# Patient Record
Sex: Female | Born: 1980 | Race: White | Hispanic: Yes | Marital: Single | Smoking: Never smoker
Health system: Southern US, Community
[De-identification: ages and names within clinical notes are randomized; demographics above are authoritative.]

## PROBLEM LIST (undated history)

## (undated) DIAGNOSIS — E785 Hyperlipidemia, unspecified: Secondary | ICD-10-CM

## (undated) DIAGNOSIS — K649 Unspecified hemorrhoids: Secondary | ICD-10-CM

## (undated) DIAGNOSIS — R569 Unspecified convulsions: Secondary | ICD-10-CM

## (undated) DIAGNOSIS — J45909 Unspecified asthma, uncomplicated: Secondary | ICD-10-CM

## (undated) DIAGNOSIS — F79 Unspecified intellectual disabilities: Secondary | ICD-10-CM

## (undated) HISTORY — DX: Unspecified asthma, uncomplicated: J45.909

## (undated) HISTORY — PX: NO PAST SURGERIES: SHX2092

## (undated) HISTORY — DX: Hyperlipidemia, unspecified: E78.5

---

## 2008-02-28 ENCOUNTER — Emergency Department (HOSPITAL_COMMUNITY): Admission: EM | Admit: 2008-02-28 | Discharge: 2008-02-28 | Payer: Self-pay | Admitting: Emergency Medicine

## 2011-02-19 ENCOUNTER — Encounter: Payer: Self-pay | Admitting: Emergency Medicine

## 2011-02-19 ENCOUNTER — Emergency Department (HOSPITAL_COMMUNITY)
Admission: EM | Admit: 2011-02-19 | Discharge: 2011-02-19 | Disposition: A | Discharge status: Home / Self Care | Payer: Medicaid Other | Attending: Emergency Medicine | Admitting: Emergency Medicine

## 2011-02-19 ENCOUNTER — Emergency Department (HOSPITAL_COMMUNITY): Payer: Medicaid Other

## 2011-02-19 DIAGNOSIS — R05 Cough: Secondary | ICD-10-CM | POA: Insufficient documentation

## 2011-02-19 DIAGNOSIS — R509 Fever, unspecified: Secondary | ICD-10-CM | POA: Insufficient documentation

## 2011-02-19 DIAGNOSIS — R059 Cough, unspecified: Secondary | ICD-10-CM | POA: Insufficient documentation

## 2011-02-19 DIAGNOSIS — R0602 Shortness of breath: Secondary | ICD-10-CM | POA: Insufficient documentation

## 2011-02-19 DIAGNOSIS — Z79899 Other long term (current) drug therapy: Secondary | ICD-10-CM | POA: Insufficient documentation

## 2011-02-19 DIAGNOSIS — F79 Unspecified intellectual disabilities: Secondary | ICD-10-CM | POA: Insufficient documentation

## 2011-02-19 DIAGNOSIS — J159 Unspecified bacterial pneumonia: Secondary | ICD-10-CM | POA: Insufficient documentation

## 2011-02-19 HISTORY — DX: Unspecified intellectual disabilities: F79

## 2011-02-19 MED ORDER — AZITHROMYCIN 250 MG PO TABS
500.0000 mg | ORAL_TABLET | Freq: Once | ORAL | Status: AC
  Administered 2011-02-19: 500 mg via ORAL
  Filled 2011-02-19: qty 2

## 2011-02-19 MED ORDER — AZITHROMYCIN 250 MG PO TABS: 250.0000 mg | ORAL_TABLET | Freq: Every day | ORAL | Status: AC

## 2011-02-19 NOTE — ED Notes (Signed)
Patient transported to X-ray 

## 2011-02-19 NOTE — ED Notes (Signed)
Crushed pills and administered with applesauce. well tolerated

## 2011-02-19 NOTE — ED Provider Notes (Signed)
History     CSN: [id number] Arrival date & time: 02/19/2011  3:50 AM   First MD Initiated Contact with Patient 02/19/11 [phone]      Chief Complaint  Patient presents with  . Cough    (Consider location/radiation/quality/duration/timing/severity/associated sxs/prior treatment) Patient is a 30 y.o. female presenting with cough. The history is provided by the patient and a parent. History Limited By: Mental retardation.  Cough This is a new problem. The current episode started yesterday. The problem occurs every few minutes. The problem has not changed since onset.The cough is productive of sputum. Maximum temperature: Subjective fever not measured. The fever has been present for less than 1 day. Pertinent negatives include no chest pain, no chills, no weight loss, no headaches, no rhinorrhea, no sore throat, no myalgias, no shortness of breath, no wheezing and no eye redness. She has tried nothing for the symptoms. She is not a smoker. Her past medical history does not include asthma.   moderate in severity. No pain or radiation. Limited historian secondary to chronic medical condition.  Past Medical History  Diagnosis Date  . Mental retardation     History reviewed. No pertinent past surgical history.  No family history on file.  History  Substance Use Topics  . Smoking status: Never Smoker   . Smokeless tobacco: Not on file  . Alcohol Use: No    OB History    Grav Para Term Preterm Abortions TAB SAB Ect Mult Living                  Review of Systems  Constitutional: Negative for fever, chills and weight loss.  HENT: Negative for sore throat, rhinorrhea, neck pain and neck stiffness.   Eyes: Negative for pain and redness.  Respiratory: Positive for cough. Negative for shortness of breath and wheezing.   Cardiovascular: Negative for chest pain.  Gastrointestinal: Negative for abdominal pain.  Genitourinary: Negative for dysuria.  Musculoskeletal: Negative for myalgias  and back pain.  Skin: Negative for rash.  Neurological: Negative for headaches.  All other systems reviewed and are negative.    Allergies  Review of patient's allergies indicates no known allergies.  Home Medications   Current Outpatient Rx  Name Route Sig Dispense Refill  . TEGRETOL PO Oral Take 2 tablets by mouth 3 (three) times daily.        BP 148/75  Pulse 92  Temp(Src) 98.2 F (36.8 C) (Oral)  Resp 14  SpO2 97%  LMP 01/22/2011  Physical Exam  Constitutional: She is oriented to person, place, and time. She appears well-developed and well-nourished.  HENT:  Head: Normocephalic and atraumatic.  Eyes: Conjunctivae and EOM are normal. Pupils are equal, round, and reactive to light.  Neck: Trachea normal. Neck supple. No thyromegaly present.  Cardiovascular: Normal rate, regular rhythm, S1 normal, S2 normal and normal pulses.     No systolic murmur is present   No diastolic murmur is present  Pulses:      Radial pulses are 2+ on the right side, and 2+ on the left side.  Pulmonary/Chest: Effort normal and breath sounds normal. She has no wheezes. She has no rhonchi. She has no rales. She exhibits no tenderness.  Abdominal: Soft. Normal appearance and bowel sounds are normal. There is no tenderness. There is no CVA tenderness and negative Murphy's sign.  Musculoskeletal:       BLE:s Calves nontender, no cords or erythema, negative Homans sign  Neurological: She is alert and oriented to  person, place, and time. She has normal strength. No cranial nerve deficit or sensory deficit. GCS eye subscore is 4. GCS verbal subscore is 5. GCS motor subscore is 6.  Skin: Skin is warm and dry. No rash noted. She is not diaphoretic.  Psychiatric: Her speech is normal.       Cooperative and appropriate    ED Course  Procedures (including critical care time)  Labs Reviewed - No data to display Dg Chest 2 View  02/19/2011  *RADIOLOGY REPORT*  Clinical Data: Shortness of breath,  cough and fever.  CHEST - 2 VIEW  Comparison: Chest radiograph performed 02/28/2008  Findings: The lungs are mildly hypoexpanded.  Minimal right basilar and left midlung opacity may reflect mild pneumonia.  There is no evidence of pleural effusion or pneumothorax.  The heart is normal in size; the mediastinal contour is within normal limits.  No acute osseous abnormalities are seen.  IMPRESSION: Minimal right basilar and left midlung opacity may reflect mild pneumonia.  Original Report Authenticated By: Tonia Ghent, M.D.   Pulse ox 95% room air is adequate  MDM   Cough and subjective fevers with pneumonia on chest x-ray as above. Stable for outpatient treatment        Sunnie Nielsen, MD 02/19/11 [phone]

## 2011-02-19 NOTE — ED Notes (Signed)
MOTHER REPORTS PERSISTENT DRY COUGH FOR 4 DAYS , CHILLS ,  NO FEVER , CHEST CONGESTION /SOB .

## 2011-02-19 NOTE — ED Notes (Signed)
Patient has downs syndrome.  mom & sister @ bedside and provides history. Pt AAO@baseline .

## 2011-02-19 NOTE — ED Notes (Signed)
MD at bedside. 

## 2012-05-23 ENCOUNTER — Ambulatory Visit (INDEPENDENT_AMBULATORY_CARE_PROVIDER_SITE_OTHER): Payer: Medicaid Other | Admitting: General Surgery

## 2012-05-25 ENCOUNTER — Encounter (INDEPENDENT_AMBULATORY_CARE_PROVIDER_SITE_OTHER): Payer: Self-pay

## 2012-05-31 ENCOUNTER — Encounter (INDEPENDENT_AMBULATORY_CARE_PROVIDER_SITE_OTHER): Payer: Self-pay | Admitting: General Surgery

## 2012-06-06 ENCOUNTER — Encounter (INDEPENDENT_AMBULATORY_CARE_PROVIDER_SITE_OTHER): Payer: Self-pay | Admitting: General Surgery

## 2012-08-25 ENCOUNTER — Encounter (INDEPENDENT_AMBULATORY_CARE_PROVIDER_SITE_OTHER): Payer: Self-pay | Admitting: General Surgery

## 2012-08-25 ENCOUNTER — Ambulatory Visit (INDEPENDENT_AMBULATORY_CARE_PROVIDER_SITE_OTHER): Payer: Medicaid Other | Admitting: General Surgery

## 2012-08-25 VITALS — BP 120/68 | HR 88 | Temp 97.6°F | Resp 18 | Ht <= 58 in | Wt 105.0 lb

## 2012-08-25 DIAGNOSIS — K429 Umbilical hernia without obstruction or gangrene: Secondary | ICD-10-CM

## 2012-08-25 NOTE — Progress Notes (Signed)
Patient ID: Christine Leonard, female   DOB: Apr 16, 1980, 32 y.o.   MRN: [id number]  Chief Complaint  Patient presents with  . New Evaluation    eval unb hernia    HPI Christine Leonard is a 32 y.o. female.  The pt is a 32 y/o F with MR Presents today with her Spanish speaking mother as well as brother..  Pt has had an UH for several years.  Pt is unable to speak, but family feels she c/o abd pain. No h/o osbtructive sx.  HPI  Past Medical History  Diagnosis Date  . Mental retardation   . Asthma   . Hyperlipidemia     History reviewed. No pertinent past surgical history.  History reviewed. No pertinent family history.  Social History History  Substance Use Topics  . Smoking status: Never Smoker   . Smokeless tobacco: Never Used  . Alcohol Use: No    No Known Allergies  Current Outpatient Prescriptions  Medication Sig Dispense Refill  . albuterol (PROVENTIL) (2.5 MG/3ML) 0.083% nebulizer solution Take 2.5 mg by nebulization every 6 (six) hours as needed for wheezing.      . CarBAMazepine (TEGRETOL PO) Take 2 tablets by mouth 3 (three) times daily.        . montelukast (SINGULAIR) 10 MG tablet Take 10 mg by mouth at bedtime.      Marland Kitchen NAPROXEN PO Take by mouth.       No current facility-administered medications for this visit.    Review of Systems Review of Systems  Constitutional: Negative.   HENT: Negative.   Eyes: Negative.   Respiratory: Negative.   Cardiovascular: Negative.   Gastrointestinal: Negative.   Neurological: Negative.     Blood pressure 120/68, pulse 88, temperature 97.6 F (36.4 C), temperature source Temporal, resp. rate 18, height 3\' 10"  (1.168 m), weight 105 lb (47.628 kg).  Physical Exam Physical Exam  Constitutional: She is oriented to person, place, and time. She appears well-developed and well-nourished.  HENT:  Head: Normocephalic and atraumatic.  Eyes: Conjunctivae and EOM are normal. Pupils are equal, round, and reactive to light.  Neck:  Normal range of motion. Neck supple.  Cardiovascular: Normal rate, regular rhythm and normal heart sounds.   Pulmonary/Chest: Effort normal and breath sounds normal.  Abdominal: Soft. Bowel sounds are normal. There is tenderness (at umb). There is no rebound and no guarding. A hernia is present. Hernia confirmed positive in the ventral area (reducible).  Musculoskeletal: Normal range of motion.  Neurological: She is alert and oriented to person, place, and time.  Skin: Skin is dry.    Data Reviewed none  Assessment    32 year old female with MR and umbilical hernia.     Plan    1. We can proceed the operating room for a laparoscopic umbilical hernia repair with mesh.  2.All risks and benefits were discussed with the patient, to generally include infection, bleeding, damage to surrounding structures, acute and chronic nerve pain, and recurrence. Alternatives were offered and described.  All questions were answered and the patient voiced understanding of the procedure and wishes to proceed at this point.         [name]., Sylvanna Burggraf 08/25/2012, 10:58 AM

## 2012-09-12 ENCOUNTER — Ambulatory Visit (INDEPENDENT_AMBULATORY_CARE_PROVIDER_SITE_OTHER): Payer: Medicaid Other | Admitting: General Surgery

## 2012-09-13 ENCOUNTER — Encounter (HOSPITAL_COMMUNITY)
Admission: RE | Admit: 2012-09-13 | Discharge: 2012-09-13 | Disposition: A | Discharge status: Home / Self Care | Payer: Medicaid Other | Source: Ambulatory Visit | Attending: General Surgery | Admitting: General Surgery

## 2012-09-13 NOTE — Pre-Procedure Instructions (Signed)
Merikay Faulstich  09/13/2012   Your procedure is scheduled on:  Mon, June 30 @ 7:30 AM  Report to [facility] at 5:30 AM.  Call this number if you have problems the morning of surgery: [phone]   Remember:   Do not eat food or drink liquids after midnight.   Take these medicines the morning of surgery with A SIP OF WATER: Albuterol<Bring Your Inhaler With You> and Tegretol(Carbamazepine)                Stop taking your Naproxen,No Goody's,BC's,Ibuprofen,Fish Oil,or any Herbal Medications   Do not wear jewelry, make-up or nail polish.  Do not wear lotions, powders, or perfumes. You may wear deodorant.  Do not shave 48 hours prior to surgery.  Do not bring valuables to the hospital.  [facility] is not responsible                   for any belongings or valuables.  Contacts, dentures or bridgework may not be worn into surgery.  Leave suitcase in the car. After surgery it may be brought to your room.  For patients admitted to the hospital, checkout time is 11:00 AM the day of  discharge.   Patients discharged the day of surgery will not be allowed to drive  home.    Special Instructions: Shower using CHG 2 nights before surgery and the night before surgery.  If you shower the day of surgery use CHG.  Use special wash - you have one bottle of CHG for all showers.  You should use approximately 1/3 of the bottle for each shower.   Please read over the following fact sheets that you were given: Pain Booklet, Coughing and Deep Breathing, MRSA Information and Surgical Site Infection Prevention

## 2012-09-14 ENCOUNTER — Ambulatory Visit (HOSPITAL_COMMUNITY)
Admission: RE | Admit: 2012-09-14 | Discharge: 2012-09-14 | Disposition: A | Discharge status: Home / Self Care | Payer: Medicaid Other | Source: Ambulatory Visit | Attending: Anesthesiology | Admitting: Anesthesiology

## 2012-09-14 ENCOUNTER — Encounter (HOSPITAL_COMMUNITY)
Admission: RE | Admit: 2012-09-14 | Discharge: 2012-09-14 | Disposition: A | Discharge status: Home / Self Care | Payer: Medicaid Other | Source: Ambulatory Visit | Attending: General Surgery | Admitting: General Surgery

## 2012-09-14 ENCOUNTER — Encounter (HOSPITAL_COMMUNITY): Payer: Self-pay

## 2012-09-14 DIAGNOSIS — Z01818 Encounter for other preprocedural examination: Secondary | ICD-10-CM | POA: Insufficient documentation

## 2012-09-14 DIAGNOSIS — Z01812 Encounter for preprocedural laboratory examination: Secondary | ICD-10-CM | POA: Insufficient documentation

## 2012-09-14 DIAGNOSIS — J45909 Unspecified asthma, uncomplicated: Secondary | ICD-10-CM | POA: Insufficient documentation

## 2012-09-14 HISTORY — DX: Unspecified convulsions: R56.9

## 2012-09-14 HISTORY — DX: Unspecified hemorrhoids: K64.9

## 2012-09-14 LAB — SURGICAL PCR SCREEN
MRSA, PCR: NEGATIVE
Staphylococcus aureus: POSITIVE — AB

## 2012-09-14 LAB — CBC
Platelets: 308 10*3/uL (ref 150–400)
RBC: 4.23 MIL/uL (ref 3.87–5.11)
WBC: 11.4 10*3/uL — ABNORMAL HIGH (ref 4.0–10.5)

## 2012-09-14 LAB — BASIC METABOLIC PANEL
CO2: 25 mEq/L (ref 19–32)
Calcium: 8.7 mg/dL (ref 8.4–10.5)
GFR calc Af Amer: >90 mL/min (ref >90)
GFR calc non Af Amer: >90 mL/min (ref >90)
Sodium: 137 mEq/L (ref 135–145)

## 2012-09-14 LAB — HCG, SERUM, QUALITATIVE: Preg, Serum: NEGATIVE

## 2012-09-14 MED ORDER — CHLORHEXIDINE GLUCONATE 4 % EX LIQD: 1.0000 "application " | Freq: Once | CUTANEOUS | Status: DC

## 2012-09-14 NOTE — Progress Notes (Signed)
Pt doesn't have a cardiologist  Denies ever having an echo/heart cath/stress test  Medical Md is Dr.Cooper on [address] from where [employer] was)  Denies EKG or CXR in past yr

## 2012-09-17 MED ORDER — CEFAZOLIN SODIUM-DEXTROSE 2-3 GM-% IV SOLR
2.0000 g | INTRAVENOUS | Status: AC
  Administered 2012-09-18: 1 g via INTRAVENOUS
  Filled 2012-09-17: qty 50

## 2012-09-18 ENCOUNTER — Ambulatory Visit (HOSPITAL_COMMUNITY): Payer: Medicaid Other | Admitting: Anesthesiology

## 2012-09-18 ENCOUNTER — Encounter (HOSPITAL_COMMUNITY)
Admission: RE | Disposition: A | Discharge status: Home / Self Care | Payer: Self-pay | Source: Ambulatory Visit | Attending: General Surgery

## 2012-09-18 ENCOUNTER — Encounter (HOSPITAL_COMMUNITY): Payer: Self-pay | Admitting: Pharmacy Technician

## 2012-09-18 ENCOUNTER — Observation Stay (HOSPITAL_COMMUNITY)
Admission: RE | Admit: 2012-09-18 | Discharge: 2012-09-19 | Disposition: A | Discharge status: Home / Self Care | Payer: Medicaid Other | Source: Ambulatory Visit | Attending: General Surgery | Admitting: General Surgery

## 2012-09-18 ENCOUNTER — Encounter (HOSPITAL_COMMUNITY): Payer: Self-pay | Admitting: *Deleted

## 2012-09-18 ENCOUNTER — Encounter (HOSPITAL_COMMUNITY): Payer: Self-pay | Admitting: Anesthesiology

## 2012-09-18 DIAGNOSIS — K429 Umbilical hernia without obstruction or gangrene: Secondary | ICD-10-CM

## 2012-09-18 DIAGNOSIS — K420 Umbilical hernia with obstruction, without gangrene: Principal | ICD-10-CM | POA: Insufficient documentation

## 2012-09-18 DIAGNOSIS — Z79899 Other long term (current) drug therapy: Secondary | ICD-10-CM | POA: Insufficient documentation

## 2012-09-18 DIAGNOSIS — F79 Unspecified intellectual disabilities: Secondary | ICD-10-CM | POA: Insufficient documentation

## 2012-09-18 DIAGNOSIS — Z8719 Personal history of other diseases of the digestive system: Secondary | ICD-10-CM

## 2012-09-18 HISTORY — PX: INSERTION OF MESH: SHX5868

## 2012-09-18 HISTORY — PX: UMBILICAL HERNIA REPAIR: SHX196

## 2012-09-18 SURGERY — REPAIR, HERNIA, UMBILICAL, LAPAROSCOPIC
Anesthesia: General | Site: Abdomen | Wound class: Clean

## 2012-09-18 MED ORDER — GLYCOPYRROLATE 0.2 MG/ML IJ SOLN
INTRAMUSCULAR | Status: DC | PRN
  Administered 2012-09-18: .1 mg via INTRAVENOUS

## 2012-09-18 MED ORDER — MONTELUKAST SODIUM 10 MG PO TABS
10.0000 mg | ORAL_TABLET | Freq: Every day | ORAL | Status: DC
  Administered 2012-09-18: 10 mg via ORAL
  Filled 2012-09-18 (×2): qty 1

## 2012-09-18 MED ORDER — CARBAMAZEPINE 100 MG/5ML PO SUSP
100.0000 mg | Freq: Two times a day (BID) | ORAL | Status: DC
  Administered 2012-09-18 – 2012-09-19 (×3): 100 mg via ORAL
  Filled 2012-09-18 (×4): qty 5

## 2012-09-18 MED ORDER — OXYCODONE-ACETAMINOPHEN 5-325 MG PO TABS: 1.0000 | ORAL_TABLET | ORAL | Status: DC | PRN

## 2012-09-18 MED ORDER — LACTATED RINGERS IV SOLN
INTRAVENOUS | Status: DC | PRN
  Administered 2012-09-18: 08:00:00 via INTRAVENOUS

## 2012-09-18 MED ORDER — PROPOFOL 10 MG/ML IV BOLUS
INTRAVENOUS | Status: DC | PRN
  Administered 2012-09-18: 40 mg via INTRAVENOUS
  Administered 2012-09-18: 50 mg via INTRAVENOUS
  Administered 2012-09-18: 160 mg via INTRAVENOUS

## 2012-09-18 MED ORDER — NEOSTIGMINE METHYLSULFATE 1 MG/ML IJ SOLN
INTRAMUSCULAR | Status: DC | PRN
  Administered 2012-09-18: 1 mg via INTRAVENOUS

## 2012-09-18 MED ORDER — CEFAZOLIN SODIUM 1-5 GM-% IV SOLN
INTRAVENOUS | Status: AC
  Filled 2012-09-18: qty 50

## 2012-09-18 MED ORDER — OXYCODONE HCL 5 MG/5ML PO SOLN: 5.0000 mg | Freq: Once | ORAL | Status: DC | PRN

## 2012-09-18 MED ORDER — BUPIVACAINE HCL 0.25 % IJ SOLN
INTRAMUSCULAR | Status: DC | PRN
  Administered 2012-09-18: 8 mL

## 2012-09-18 MED ORDER — HYDROMORPHONE HCL PF 1 MG/ML IJ SOLN
0.2500 mg | INTRAMUSCULAR | Status: DC | PRN
  Administered 2012-09-18 (×4): 0.25 mg via INTRAVENOUS

## 2012-09-18 MED ORDER — FENTANYL CITRATE 0.05 MG/ML IJ SOLN
INTRAMUSCULAR | Status: DC | PRN
  Administered 2012-09-18 (×2): 50 ug via INTRAVENOUS

## 2012-09-18 MED ORDER — ALBUTEROL SULFATE (5 MG/ML) 0.5% IN NEBU: 2.5000 mg | INHALATION_SOLUTION | RESPIRATORY_TRACT | Status: DC | PRN

## 2012-09-18 MED ORDER — HYDROMORPHONE HCL PF 1 MG/ML IJ SOLN
INTRAMUSCULAR | Status: AC
  Filled 2012-09-18: qty 1

## 2012-09-18 MED ORDER — SODIUM CHLORIDE 0.9 % IR SOLN
Status: DC | PRN
  Administered 2012-09-18: 1000 mL

## 2012-09-18 MED ORDER — 0.9 % SODIUM CHLORIDE (POUR BTL) OPTIME
TOPICAL | Status: DC | PRN
  Administered 2012-09-18: 1000 mL

## 2012-09-18 MED ORDER — LIDOCAINE HCL (CARDIAC) 20 MG/ML IV SOLN
INTRAVENOUS | Status: DC | PRN
  Administered 2012-09-18: 40 mg via INTRAVENOUS

## 2012-09-18 MED ORDER — ONDANSETRON HCL 4 MG/2ML IJ SOLN
INTRAMUSCULAR | Status: DC | PRN
  Administered 2012-09-18: 4 mg via INTRAVENOUS

## 2012-09-18 MED ORDER — BUPIVACAINE HCL (PF) 0.25 % IJ SOLN
INTRAMUSCULAR | Status: AC
  Filled 2012-09-18: qty 30

## 2012-09-18 MED ORDER — HYDROCODONE-ACETAMINOPHEN 5-325 MG PO TABS
1.0000 | ORAL_TABLET | ORAL | Status: DC | PRN
  Administered 2012-09-18: 1 via ORAL
  Administered 2012-09-18 – 2012-09-19 (×2): 2 via ORAL
  Filled 2012-09-18: qty 1
  Filled 2012-09-18 (×3): qty 2

## 2012-09-18 MED ORDER — ONDANSETRON HCL 4 MG/2ML IJ SOLN: 4.0000 mg | Freq: Four times a day (QID) | INTRAMUSCULAR | Status: DC | PRN

## 2012-09-18 MED ORDER — ROCURONIUM BROMIDE 100 MG/10ML IV SOLN
INTRAVENOUS | Status: DC | PRN
  Administered 2012-09-18: 20 mg via INTRAVENOUS

## 2012-09-18 MED ORDER — METOCLOPRAMIDE HCL 5 MG/ML IJ SOLN: 10.0000 mg | Freq: Once | INTRAMUSCULAR | Status: DC | PRN

## 2012-09-18 MED ORDER — HYDROMORPHONE HCL PF 1 MG/ML IJ SOLN
INTRAMUSCULAR | Status: AC
  Administered 2012-09-18: 0.25 mg
  Filled 2012-09-18: qty 1

## 2012-09-18 MED ORDER — ARTIFICIAL TEARS OP OINT: TOPICAL_OINTMENT | OPHTHALMIC | Status: DC | PRN

## 2012-09-18 MED ORDER — OXYCODONE HCL 5 MG PO TABS: 5.0000 mg | ORAL_TABLET | Freq: Once | ORAL | Status: DC | PRN

## 2012-09-18 SURGICAL SUPPLY — 44 items
APPLIER CLIP LOGIC TI 5 (MISCELLANEOUS) IMPLANT
APPLIER CLIP ROT 10 11.4 M/L (STAPLE)
BENZOIN TINCTURE PRP APPL 2/3 (GAUZE/BANDAGES/DRESSINGS) ×2 IMPLANT
BINDER ABD UNIV 12 30-45 (MISCELLANEOUS) ×1 IMPLANT
BINDER ABDOMINAL 12 (MISCELLANEOUS) ×2
BLADE SURG ROTATE 9660 (MISCELLANEOUS) IMPLANT
CANISTER SUCTION 2500CC (MISCELLANEOUS) IMPLANT
CHLORAPREP W/TINT 26ML (MISCELLANEOUS) ×2 IMPLANT
CLIP APPLIE ROT 10 11.4 M/L (STAPLE) IMPLANT
CLOTH BEACON ORANGE TIMEOUT ST (SAFETY) ×2 IMPLANT
COVER SURGICAL LIGHT HANDLE (MISCELLANEOUS) ×2 IMPLANT
DEVICE SECURE STRAP 25 ABSORB (INSTRUMENTS) ×2 IMPLANT
DEVICE TROCAR PUNCTURE CLOSURE (ENDOMECHANICALS) ×2 IMPLANT
DRAPE UTILITY 15X26 W/TAPE STR (DRAPE) ×4 IMPLANT
ELECT REM PT RETURN 9FT ADLT (ELECTROSURGICAL) ×2
ELECTRODE REM PT RTRN 9FT ADLT (ELECTROSURGICAL) ×1 IMPLANT
GLOVE BIO SURGEON STRL SZ7.5 (GLOVE) ×4 IMPLANT
GLOVE BIOGEL PI IND STRL 7.5 (GLOVE) ×2 IMPLANT
GLOVE BIOGEL PI INDICATOR 7.5 (GLOVE) ×2
GLOVE ECLIPSE 7.5 STRL STRAW (GLOVE) ×2 IMPLANT
GOWN STRL NON-REIN LRG LVL3 (GOWN DISPOSABLE) ×4 IMPLANT
GOWN STRL REIN XL XLG (GOWN DISPOSABLE) ×2 IMPLANT
KIT BASIN OR (CUSTOM PROCEDURE TRAY) ×2 IMPLANT
KIT ROOM TURNOVER OR (KITS) ×2 IMPLANT
MARKER SKIN DUAL TIP RULER LAB (MISCELLANEOUS) ×2 IMPLANT
MESH PARIETEX 4.7 (Mesh General) ×2 IMPLANT
NEEDLE INSUFFLATION 14GA 120MM (NEEDLE) ×2 IMPLANT
NEEDLE SPNL 22GX3.5 QUINCKE BK (NEEDLE) IMPLANT
NS IRRIG 1000ML POUR BTL (IV SOLUTION) ×2 IMPLANT
PAD ARMBOARD 7.5X6 YLW CONV (MISCELLANEOUS) ×4 IMPLANT
SCISSORS LAP 5X35 DISP (ENDOMECHANICALS) ×2 IMPLANT
SET IRRIG TUBING LAPAROSCOPIC (IRRIGATION / IRRIGATOR) ×2 IMPLANT
SLEEVE ENDOPATH XCEL 5M (ENDOMECHANICALS) ×4 IMPLANT
SPONGE GAUZE 4X4 12PLY (GAUZE/BANDAGES/DRESSINGS) ×2 IMPLANT
SUT CHROMIC 2 0 SH (SUTURE) ×2 IMPLANT
SUT MNCRL AB 4-0 PS2 18 (SUTURE) ×2 IMPLANT
SUT PROLENE 2 0 KS (SUTURE) IMPLANT
TOWEL OR 17X24 6PK STRL BLUE (TOWEL DISPOSABLE) ×2 IMPLANT
TOWEL OR 17X26 10 PK STRL BLUE (TOWEL DISPOSABLE) ×2 IMPLANT
TRAY FOLEY CATH 14FR (SET/KITS/TRAYS/PACK) IMPLANT
TRAY LAPAROSCOPIC (CUSTOM PROCEDURE TRAY) ×2 IMPLANT
TROCAR XCEL BLUNT TIP 100MML (ENDOMECHANICALS) IMPLANT
TROCAR XCEL NON-BLD 11X100MML (ENDOMECHANICALS) IMPLANT
TROCAR XCEL NON-BLD 5MMX100MML (ENDOMECHANICALS) ×2 IMPLANT

## 2012-09-18 NOTE — H&P (View-Only) (Signed)
Patient ID: Christine Leonard, female   DOB: Feb 06, 1981, 32 y.o.   MRN: [id number]  Chief Complaint  Patient presents with  . New Evaluation    eval unb hernia    HPI Christine Leonard is a 32 y.o. female.  The pt is a 32 y/o F with MR Presents today with her Spanish speaking mother as well as brother..  Pt has had an UH for several years.  Pt is unable to speak, but family feels she c/o abd pain. No h/o osbtructive sx.  HPI  Past Medical History  Diagnosis Date  . Mental retardation   . Asthma   . Hyperlipidemia     History reviewed. No pertinent past surgical history.  History reviewed. No pertinent family history.  Social History History  Substance Use Topics  . Smoking status: Never Smoker   . Smokeless tobacco: Never Used  . Alcohol Use: No    No Known Allergies  Current Outpatient Prescriptions  Medication Sig Dispense Refill  . albuterol (PROVENTIL) (2.5 MG/3ML) 0.083% nebulizer solution Take 2.5 mg by nebulization every 6 (six) hours as needed for wheezing.      . CarBAMazepine (TEGRETOL PO) Take 2 tablets by mouth 3 (three) times daily.        . montelukast (SINGULAIR) 10 MG tablet Take 10 mg by mouth at bedtime.      Marland Kitchen NAPROXEN PO Take by mouth.       No current facility-administered medications for this visit.    Review of Systems Review of Systems  Constitutional: Negative.   HENT: Negative.   Eyes: Negative.   Respiratory: Negative.   Cardiovascular: Negative.   Gastrointestinal: Negative.   Neurological: Negative.     Blood pressure 120/68, pulse 88, temperature 97.6 F (36.4 C), temperature source Temporal, resp. rate 18, height 3\' 10"  (1.168 m), weight 105 lb (47.628 kg).  Physical Exam Physical Exam  Constitutional: She is oriented to person, place, and time. She appears well-developed and well-nourished.  HENT:  Head: Normocephalic and atraumatic.  Eyes: Conjunctivae and EOM are normal. Pupils are equal, round, and reactive to light.  Neck:  Normal range of motion. Neck supple.  Cardiovascular: Normal rate, regular rhythm and normal heart sounds.   Pulmonary/Chest: Effort normal and breath sounds normal.  Abdominal: Soft. Bowel sounds are normal. There is tenderness (at umb). There is no rebound and no guarding. A hernia is present. Hernia confirmed positive in the ventral area (reducible).  Musculoskeletal: Normal range of motion.  Neurological: She is alert and oriented to person, place, and time.  Skin: Skin is dry.    Data Reviewed none  Assessment    32 year old female with MR and umbilical hernia.     Plan    1. We can proceed the operating room for a laparoscopic umbilical hernia repair with mesh.  2.All risks and benefits were discussed with the patient, to generally include infection, bleeding, damage to surrounding structures, acute and chronic nerve pain, and recurrence. Alternatives were offered and described.  All questions were answered and the patient voiced understanding of the procedure and wishes to proceed at this point.         Christine Leonard., Christine Leonard 08/25/2012, 10:58 AM

## 2012-09-18 NOTE — Anesthesia Postprocedure Evaluation (Signed)
Anesthesia Post Note  Patient: Christine Leonard  Procedure(s) Performed: Procedure(s) (LRB): LAPAROSCOPIC UMBILICAL HERNIA (N/A) INSERTION OF MESH (N/A)  Anesthesia type: General  Patient location: PACU  Post pain: Pain level controlled  Post assessment: Patient's Cardiovascular Status Stable  Last Vitals:  Filed Vitals:   09/18/12 1007  BP: 114/71  Pulse: 87  Temp:   Resp: 15    Post vital signs: Reviewed and stable  Level of consciousness: alert  Complications: No apparent anesthesia complications

## 2012-09-18 NOTE — Anesthesia Preprocedure Evaluation (Addendum)
Anesthesia Evaluation  Patient identified by MRN, date of birth, ID band Patient awake    Reviewed: Allergy & Precautions, H&P , NPO status , Patient's Chart, lab work & pertinent test results, reviewed documented beta blocker date and time   Airway Mallampati: III TM Distance: >3 FB Neck ROM: full    Dental   Pulmonary asthma ,  breath sounds clear to auscultation        Cardiovascular negative cardio ROS  Rhythm:regular     Neuro/Psych Seizures -,  PSYCHIATRIC DISORDERS    GI/Hepatic negative GI ROS, Neg liver ROS,   Endo/Other  Morbid obesity  Renal/GU negative Renal ROS  negative genitourinary   Musculoskeletal   Abdominal   Peds  Hematology negative hematology ROS (+)   Anesthesia Other Findings See surgeon's H&P   Reproductive/Obstetrics negative OB ROS                          Anesthesia Physical Anesthesia Plan  ASA: III  Anesthesia Plan: General   Post-op Pain Management:    Induction: Intravenous  Airway Management Planned: Oral ETT  Additional Equipment:   Intra-op Plan:   Post-operative Plan: Extubation in OR  Informed Consent: I have reviewed the patients History and Physical, chart, labs and discussed the procedure including the risks, benefits and alternatives for the proposed anesthesia with the patient or authorized representative who has indicated his/her understanding and acceptance.   Dental Advisory Given  Plan Discussed with: CRNA and Surgeon  Anesthesia Plan Comments:         Anesthesia Quick Evaluation

## 2012-09-18 NOTE — Transfer of Care (Signed)
Immediate Anesthesia Transfer of Care Note  Patient: Christine Leonard  Procedure(s) Performed: Procedure(s): LAPAROSCOPIC UMBILICAL HERNIA (N/A) INSERTION OF MESH (N/A)  Patient Location: PACU  Anesthesia Type:General  Level of Consciousness: responses to stimulation moaning with eyes closed oral airway in place   Airway & Oxygen Therapy: Patient Spontanous Breathing and Patient connected to face mask oxygen  Post-op Assessment: Report given to PACU RN and Post -op Vital signs reviewed and stable  Post vital signs: Reviewed and stable  Complications: No apparent anesthesia complications

## 2012-09-18 NOTE — Anesthesia Procedure Notes (Signed)
Procedure Name: Intubation Date/Time: 09/18/2012 7:54 AM Performed by: Sherie Don Pre-anesthesia Checklist: Patient identified, Emergency Drugs available, Suction available, Patient being monitored and Timeout performed Patient Re-evaluated:Patient Re-evaluated prior to inductionOxygen Delivery Method: Circle system utilized Preoxygenation: Pre-oxygenation with 100% oxygen Intubation Type: Inhalational induction Ventilation: Mask ventilation without difficulty and Oral airway inserted - appropriate to patient size Laryngoscope Size: Mac and 2 (Pediatric glidescope blade with stylet) Grade View: Grade I Tube type: Oral Tube size: 5.5 mm Number of attempts: 1 Airway Equipment and Method: Stylet and Video-laryngoscopy Placement Confirmation: ETT inserted through vocal cords under direct vision,  positive ETCO2 and breath sounds checked- equal and bilateral Secured at: 20 cm Tube secured with: Tape Dental Injury: Teeth and Oropharynx as per pre-operative assessment  Comments: Inhalation induction good mask airway with small adult mask and size 80 mm oral airway easy ventilation. Glide scope intubation with pediatric blade. Reasonable neck and jaw mobility noted on pre op airway exam. Upon DL moderate sized torus noted and large tongue. laryngeal anatomy normal under direct visualization.

## 2012-09-18 NOTE — Op Note (Signed)
Pre Operative Diagnosis:  UH   Post Operative Diagnosis: same  Procedure: Lap UHR with mesh  Surgeon: Dr. Axel Filler  Assistant: none  Anesthesia: GETA  EBL: 5cc  Complications: none  Counts: reported as correct x 2  Findings:  approx 2cm UH with incarcerated omentum  Indications for procedure:  Pt is a 33 y/o F with incarcerated UH with abd pain for several months.  Communication was limited with the patient secondary to other health issues.  Details of the procedure: After the patient was consented patient was taken back to the operating room patient was then placed in supine position bilateral SCDs in place. After antibiotics were confirmed a timeout was called and all facts were verified. The Veress needle technique was used to insuflate the abdomen at Palmer's point. The abdomen was insufflated to 14 mm mercury. Subsequently a 5 mm trocar was placed a camera inserted there was no injury to any intra-abdominal organs. It was seen that there was preperitoneal incarcerated umbilical hernia. A second camera port was in placed into the left lower quadrant.   A 5mm port was placed in the epigastrum.  I Proceeded to reduce the hernia contents and hemostasis was achieved throughout with Bovie cautery secondary to the amount of omentum that was in the hernia.  The hernia sac was cleared of any abdominal omentum.  Once the hernia was cleared away a 12cm PCO parietex mesh was inserted into the abdomen.  The mesh was secured circumferentially with am Securestrap tacker in double crown fashion.  The omentum was brought over the area of the mesh. The pneumoperitoneum was evacuated  & all port sites were removed. The skin was reapproximated opportunities for Monocryl subcuticular fashion. The skin was dressed with Steri-Strips tape and gauze.  The patient was taken to the recovery room in stable condition.

## 2012-09-18 NOTE — Interval H&P Note (Signed)
History and Physical Interval Note:  09/18/2012 7:23 AM  Christine Leonard  has presented today for surgery, with the diagnosis of umbilical hernia  The various methods of treatment have been discussed with the patient and family. After consideration of risks, benefits and other options for treatment, the patient has consented to  Procedure(s): LAPAROSCOPIC UMBILICAL HERNIA (N/A) INSERTION OF MESH (N/A) as a surgical intervention .  The patient's history has been reviewed, patient examined, no change in status, stable for surgery.  I have reviewed the patient's chart and labs.  Questions were answered to the patient's satisfaction.     [name]., Jed Limerick

## 2012-09-18 NOTE — Preoperative (Signed)
Beta Blockers   Reason not to administer Beta Blockers:Not Applicable 

## 2012-09-19 NOTE — Discharge Summary (Signed)
Physician Discharge Summary  Patient ID: Christine Leonard MRN: [id number] DOB/AGE: Oct 10, 1980 31 y.o.  Admit date: 09/18/2012 Discharge date: 09/19/2012  Admission Diagnoses:s/p UHR with mesh  Discharge Diagnoses: same Active Problems:   * No active hospital problems. *   Discharged Condition: good  Hospital Course: Pt s/p UHR.  Pt admitted for pain control.  Pt did well overnight.  She tol PO.  She was voiding on her own, and getting out of bed.  VS were stable and she was deemed stable for DC and DC'd home.  Consults: None  Significant Diagnostic Studies: none  Treatments: surgery: 09/18/2012  Discharge Exam: Blood pressure 98/56, pulse 97, temperature 97.9 F (36.6 C), temperature source Oral, resp. rate 15, last menstrual period 08/28/2012, SpO2 98.00%. General appearance: alert and cooperative GI: s/nt/nd active BS, wound c/d/i  Disposition: 01-Home or Self Care  Discharge Orders   Future Appointments Provider Department Dept Phone   10/05/2012 11:00 AM Axel Filler, MD [facility], [place] [phone]   Future Orders Complete By Expires     Discharge patient  As directed         Medication List         albuterol (2.5 MG/3ML) 0.083% nebulizer solution  Commonly known as:  PROVENTIL  Take 2.5 mg by nebulization every 6 (six) hours as needed for wheezing.     carBAMazepine 100 MG/5ML suspension  Commonly known as:  TEGRETOL  Take 100 mg by mouth 2 (two) times daily.     montelukast 10 MG tablet  Commonly known as:  SINGULAIR  Take 10 mg by mouth at bedtime.     mupirocin ointment 2 %  Commonly known as:  BACTROBAN  Apply 1 application topically 2 (two) times daily.     naproxen 500 MG tablet  Commonly known as:  NAPROSYN  Take 500 mg by mouth 2 (two) times daily with a meal.     oxyCODONE-acetaminophen 5-325 MG per tablet  Commonly known as:  ROXICET  Take 1 tablet by mouth every 4 (four) hours as needed for pain.           Follow-up  Information   Follow up with Lajean Saver, MD. Schedule an appointment as soon as possible for a visit in 2 weeks.   Contact information:   [address] [place] [address] [phone]       Signed: [name]., Jed Limerick 09/19/2012, 9:31 AM

## 2012-09-19 NOTE — Progress Notes (Signed)
[id number] Patient very restless at time during the night pull IV out. Patient very restless now moaning acting like she needs to vomit. Ask mother if she thought she was sick on her stomach she stated she did not know. [id number] Dr. Lindie Spruce notified about IV was pull out and could she have something po for nausea. Dr. Lindie Spruce stated not to give any medication until Dr. Derrell Lolling sees the patient.

## 2012-09-19 NOTE — Progress Notes (Signed)
1 Day Post-Op  Subjective: Pt doing well per Mom.  Difficult to communicate with the pt.  Pt has voided.  She has taken PO but has c/o a sore throat.  Pain controlled.  Objective: Vital signs in last 24 hours: Temp:  [97 F (36.1 C)-98.9 F (37.2 C)] 97.9 F (36.6 C) (07/01 0619) Pulse Rate:  [80-114] 97 (07/01 0619) Resp:  [8-24] 15 (07/01 0619) BP: (98-154)/(37-104) 98/56 mmHg (07/01 0619) SpO2:  [87 %-100 %] 98 % (07/01 0619) Last BM Date: 09/18/12  Intake/Output from previous day: 06/30 0701 - 07/01 0700 In: 750 [I.V.:750] Out: 350 [Urine:350] Intake/Output this shift:    General appearance: alert and cooperative GI: soft, approp ttp, ND, wnd c/d/i  Lab Results:  No results found for this basename: WBC, HGB, HCT, PLT,  in the last 72 hours BMET No results found for this basename: NA, K, CL, CO2, GLUCOSE, BUN, CREATININE, CALCIUM,  in the last 72 hours PT/INR No results found for this basename: LABPROT, INR,  in the last 72 hours ABG No results found for this basename: PHART, PCO2, PO2, HCO3,  in the last 72 hours  Studies/Results: No results found.  Anti-infectives: Anti-infectives   Start     Dose/Rate Route Frequency Ordered Stop   09/18/12 0600  ceFAZolin (ANCEF) IVPB 2 g/50 mL premix     2 g 100 mL/hr over 30 Minutes Intravenous On call to O.R. 09/17/12 1511 09/18/12 0755      Assessment/Plan: s/p Procedure(s): LAPAROSCOPIC UMBILICAL HERNIA (N/A) INSERTION OF MESH (N/A) Advance diet Discharge   LOS: 1 day    [name]., [facility] 09/19/2012

## 2012-09-19 NOTE — Progress Notes (Signed)
Discharge instructions gone over with patient's mother, using interpretor, via phone. Home medications gone over. Prescription given. Diet, activity, and incisional care discussed. Follow up appointment discussed. Signs and symptoms of infection and or worsening condition gone over and mother understood instructions via the interpretor. She did not have any questions at this time. Also last page of instructions was in [place]. Mother stated she understood time, date, and location of [facility] office for the follow up visit.

## 2012-09-20 ENCOUNTER — Encounter (HOSPITAL_COMMUNITY): Payer: Self-pay | Admitting: General Surgery

## 2012-10-05 ENCOUNTER — Encounter (INDEPENDENT_AMBULATORY_CARE_PROVIDER_SITE_OTHER): Payer: Self-pay | Admitting: General Surgery

## 2012-10-05 ENCOUNTER — Ambulatory Visit (INDEPENDENT_AMBULATORY_CARE_PROVIDER_SITE_OTHER): Payer: Medicaid Other | Admitting: General Surgery

## 2012-10-05 VITALS — BP 106/68 | HR 84 | Temp 97.8°F | Resp 16 | Wt 100.6 lb

## 2012-10-05 DIAGNOSIS — Z09 Encounter for follow-up examination after completed treatment for conditions other than malignant neoplasm: Secondary | ICD-10-CM

## 2012-10-05 NOTE — Progress Notes (Signed)
Patient ID: Christine Leonard, female   DOB: 1980-12-04, 32 y.o.   MRN: [id number] The patient is a 32 year old female status post laparoscopic umbilical hernia repair with mesh. Patient has had some expected pain but has been taking care of with ibuprofen.  On exam: Wounds are clean dry and intact there is no hernia on palpation   Assessment and plan: A 32 year old who was status post laparoscopic umbilical hernia repair with mesh 1. Patient followup when necessary

## 2012-12-21 ENCOUNTER — Emergency Department (HOSPITAL_COMMUNITY): Payer: Medicaid Other

## 2012-12-21 ENCOUNTER — Encounter (HOSPITAL_COMMUNITY): Payer: Self-pay | Admitting: Adult Health

## 2012-12-21 ENCOUNTER — Emergency Department (HOSPITAL_COMMUNITY)
Admission: EM | Admit: 2012-12-21 | Discharge: 2012-12-22 | Disposition: A | Discharge status: Home / Self Care | Payer: Medicaid Other | Attending: Emergency Medicine | Admitting: Emergency Medicine

## 2012-12-21 DIAGNOSIS — Z79899 Other long term (current) drug therapy: Secondary | ICD-10-CM | POA: Insufficient documentation

## 2012-12-21 DIAGNOSIS — Z8639 Personal history of other endocrine, nutritional and metabolic disease: Secondary | ICD-10-CM | POA: Insufficient documentation

## 2012-12-21 DIAGNOSIS — H00019 Hordeolum externum unspecified eye, unspecified eyelid: Secondary | ICD-10-CM | POA: Insufficient documentation

## 2012-12-21 DIAGNOSIS — M7121 Synovial cyst of popliteal space [Baker], right knee: Secondary | ICD-10-CM

## 2012-12-21 DIAGNOSIS — M25569 Pain in unspecified knee: Secondary | ICD-10-CM | POA: Insufficient documentation

## 2012-12-21 DIAGNOSIS — Z8679 Personal history of other diseases of the circulatory system: Secondary | ICD-10-CM | POA: Insufficient documentation

## 2012-12-21 DIAGNOSIS — L989 Disorder of the skin and subcutaneous tissue, unspecified: Secondary | ICD-10-CM | POA: Insufficient documentation

## 2012-12-21 DIAGNOSIS — R52 Pain, unspecified: Secondary | ICD-10-CM | POA: Insufficient documentation

## 2012-12-21 DIAGNOSIS — J45909 Unspecified asthma, uncomplicated: Secondary | ICD-10-CM | POA: Insufficient documentation

## 2012-12-21 DIAGNOSIS — G40909 Epilepsy, unspecified, not intractable, without status epilepticus: Secondary | ICD-10-CM | POA: Insufficient documentation

## 2012-12-21 DIAGNOSIS — Z8659 Personal history of other mental and behavioral disorders: Secondary | ICD-10-CM | POA: Insufficient documentation

## 2012-12-21 DIAGNOSIS — M25561 Pain in right knee: Secondary | ICD-10-CM

## 2012-12-21 DIAGNOSIS — M7120 Synovial cyst of popliteal space [Baker], unspecified knee: Secondary | ICD-10-CM | POA: Insufficient documentation

## 2012-12-21 DIAGNOSIS — M25559 Pain in unspecified hip: Secondary | ICD-10-CM | POA: Insufficient documentation

## 2012-12-21 DIAGNOSIS — Z862 Personal history of diseases of the blood and blood-forming organs and certain disorders involving the immune mechanism: Secondary | ICD-10-CM | POA: Insufficient documentation

## 2012-12-21 DIAGNOSIS — M25551 Pain in right hip: Secondary | ICD-10-CM

## 2012-12-21 DIAGNOSIS — H00013 Hordeolum externum right eye, unspecified eyelid: Secondary | ICD-10-CM

## 2012-12-21 DIAGNOSIS — R21 Rash and other nonspecific skin eruption: Secondary | ICD-10-CM | POA: Insufficient documentation

## 2012-12-21 MED ORDER — MUPIROCIN CALCIUM 2 % EX CREA: TOPICAL_CREAM | Freq: Three times a day (TID) | CUTANEOUS | Status: DC

## 2012-12-21 MED ORDER — IBUPROFEN 800 MG PO TABS: 800.0000 mg | ORAL_TABLET | Freq: Three times a day (TID) | ORAL | Status: DC

## 2012-12-21 NOTE — ED Notes (Signed)
Pt is unable to speak for herself, she is nonverbal. Family is spanish speaking and reports that she has been c/o of her r knee hurting her to walk. When ask pt to point to area of pain, right knee is endorsed. Seems to be worse when bending knee. Pt is ambulatory. No edema, no redness, no deformity.

## 2012-12-21 NOTE — ED Provider Notes (Signed)
CSN: [id number]     Arrival date & time 12/21/12  2035 History  This chart was scribed for non-physician practitioner Dierdre Forth, PA-C working with Candyce Churn, MD by Danella Maiers, ED Scribe. This patient was seen in room TR08C/TR08C and the patient's care was started at 10:16 PM.   Chief Complaint  Patient presents with  . Knee Pain   The history is provided by a parent, a relative and medical records. The history is limited by a developmental delay. No language interpreter was used.   HPI Comments: Christine Leonard is a 32 y.o. female brought in by family with a history of mental retardation who presents to the Emergency Department complaining of right knee pain onset two weeks ago that worsened in the last three days. In the mornings she walks with her right knee pointed outward and sometimes she cannot walk at all. They have been giving her tylenol with moderate relief. They also report they are giving ibuprofen 200 mg one time in the morning and one time in the evening without improvement in pain.  Patient is unable to localize this pain. She also has a generalized rash on her arms that she has been scratching for weeks and is now bleeding.  Her mother also c/o a cyst to the back of her right knee. Patient is nonverbal therefore the history is obtained by the patient's mother and sister. Level V caveat.  Past Medical History  Diagnosis Date  . Mental retardation   . Hyperlipidemia     was on med about a yr ago but not on @ present time(has been off about 5months)  . Asthma     Albuterol prn and Singulair every night  . Seizures     hx of seizures;last one at 79yrs ago;takes Tegretol daily  . Hemorrhoid    Past Surgical History  Procedure Laterality Date  . No past surgeries    . Umbilical hernia repair N/A 09/18/2012    Procedure: LAPAROSCOPIC UMBILICAL HERNIA;  Surgeon: Axel Filler, MD;  Location: [facility] OR;  Service: General;  Laterality: N/A;  . Insertion of mesh  N/A 09/18/2012    Procedure: INSERTION OF MESH;  Surgeon: Axel Filler, MD;  Location: [facility] OR;  Service: General;  Laterality: N/A;   History reviewed. No pertinent family history. History  Substance Use Topics  . Smoking status: Never Smoker   . Smokeless tobacco: Never Used  . Alcohol Use: No   OB History   Grav Para Term Preterm Abortions TAB SAB Ect Mult Living                 Review of Systems  Unable to perform ROS: Patient nonverbal  Constitutional: Negative for fever, diaphoresis, appetite change, fatigue and unexpected weight change.  HENT: Negative for mouth sores and neck stiffness.   Eyes: Negative for visual disturbance.  Respiratory: Negative for cough, chest tightness, shortness of breath and wheezing.   Cardiovascular: Negative for chest pain.  Gastrointestinal: Negative for nausea, vomiting, abdominal pain, diarrhea and constipation.  Endocrine: Negative for polydipsia, polyphagia and polyuria.  Genitourinary: Negative for dysuria, urgency, frequency and hematuria.  Musculoskeletal: Negative for back pain.  Skin: Positive for rash.  Allergic/Immunologic: Negative for immunocompromised state.  Neurological: Negative for syncope, light-headedness and headaches.  Hematological: Does not bruise/bleed easily.  Psychiatric/Behavioral: Negative for sleep disturbance. The patient is not nervous/anxious.     Allergies  Crab  Home Medications   Current Outpatient Rx  Name  Route  Sig  Dispense  Refill  . acetaminophen (TYLENOL) 325 MG tablet   Oral   Take 650 mg by mouth every 6 (six) hours as needed for pain.         . carBAMazepine (TEGRETOL) 100 MG/5ML suspension   Oral   Take 100 mg by mouth 2 (two) times daily.         Marland Kitchen ibuprofen (ADVIL,MOTRIN) 800 MG tablet   Oral   Take 1 tablet (800 mg total) by mouth 3 (three) times daily.   21 tablet   0   . mupirocin cream (BACTROBAN) 2 %   Topical   Apply topically 3 (three) times daily. To open  wounds   15 g   0    BP 133/93  Pulse 78  Temp(Src) 98.1 F (36.7 C) (Oral)  Resp 20  Wt 100 lb 8 oz (45.587 kg)  BMI 33.42 kg/m2  SpO2 96%  LMP 11/24/2012 Physical Exam  Nursing note and vitals reviewed. Constitutional: She appears well-developed and well-nourished. No distress.  Awake, alert, nontoxic appearance  HENT:  Head: Normocephalic and atraumatic.  Right Ear: Hearing, tympanic membrane, external ear and ear canal normal.  Left Ear: Tympanic membrane, external ear and ear canal normal.  Nose: Nose normal. No mucosal edema or rhinorrhea.  Mouth/Throat: Uvula is midline, oropharynx is clear and moist and mucous membranes are normal. Mucous membranes are not dry. No edematous. No oropharyngeal exudate, posterior oropharyngeal edema, posterior oropharyngeal erythema or tonsillar abscesses.  Eyes: Conjunctivae are normal. Pupils are equal, round, and reactive to light. Right eye exhibits hordeolum. Right eye exhibits no chemosis, no discharge and no exudate. No foreign body present in the right eye. Left eye exhibits no chemosis, no discharge, no exudate and no hordeolum. No foreign body present in the left eye. No scleral icterus.  Neck: Normal range of motion and full passive range of motion without pain. Neck supple. No rigidity.  Cardiovascular: Normal rate, regular rhythm and intact distal pulses.   Pulses:      Radial pulses are 2+ on the right side, and 2+ on the left side.       Dorsalis pedis pulses are 2+ on the right side, and 2+ on the left side.       Posterior tibial pulses are 2+ on the right side, and 2+ on the left side.  Capillary refill less than 3 sec  Pulmonary/Chest: Effort normal and breath sounds normal. No respiratory distress. She has no decreased breath sounds. She has no wheezes.  Abdominal: Soft. Bowel sounds are normal. She exhibits no mass. There is no tenderness. There is no rebound and no guarding.  Musculoskeletal: Normal range of motion. She  exhibits no edema.       Right hip: Normal. She exhibits normal range of motion, normal strength, no tenderness, no bony tenderness, no swelling, no crepitus, no deformity and no laceration.       Right knee: Normal. She exhibits normal range of motion, no swelling, no effusion, no ecchymosis, no deformity, no laceration, no erythema, normal alignment, no LCL laxity, normal patellar mobility, no bony tenderness, normal meniscus and no MCL laxity. No tenderness found.       Right ankle: Normal.  Full range of motion of the right hip, right knee and right ankle without obvious pain No pain to palpation of any of the joints of the lower extremities Patient ambulates without difficulty or gait disturbance tonight. Palpable Baker's cyst of the right knee  Neurological: She  is alert.  Reflex Scores:      Patellar reflexes are 2+ on the right side and 2+ on the left side.      Achilles reflexes are 2+ on the right side and 2+ on the left side. Moves extremities without ataxia Does not follow commands, this is baseline deep tendon reflex is intact in the bilateral lower extremities  Skin: Skin is warm and dry. Lesion noted. She is not diaphoretic.  Open excoriated lesion on the right upper arm, left lower arm and back; mild erythema without induration or area of fluctuance  Psychiatric: She has a normal mood and affect.    ED Course  Procedures (including critical care time) Medications - No data to display  DIAGNOSTIC STUDIES: Oxygen Saturation is 96% on room air, normal by my interpretation.    COORDINATION OF CARE: 10:43 PM- Discussed treatment plan with pt which includes a knee and hip xray and pt agrees to plan.   Labs Review Labs Reviewed - No data to display Imaging Review Dg Hip Complete Right  12/21/2012   CLINICAL DATA:  No known injury.  EXAM: RIGHT HIP - COMPLETE 2+ VIEW  COMPARISON:  None.  FINDINGS: There is no evidence of hip fracture or dislocation. There is no evidence of  arthropathy or other focal bone abnormality.  IMPRESSION: Negative.   Electronically Signed   By: Charlett Nose M.D.   On: 12/21/2012 23:20   Dg Knee Complete 4 Views Right  12/21/2012   *RADIOLOGY REPORT*  Clinical Data: Chronic pain for 1 year in the right knee, more severe today  RIGHT KNEE - COMPLETE 4+ VIEW  Comparison: None.  Findings: Limited positioning due to the patient's inability to been the knee.  No fracture or dislocation.  Minimal narrowing and spurring of the medial compartment.  No joint effusion.  IMPRESSION: No acute findings.   Original Report Authenticated By: Esperanza Heir, M.D.    MDM   1. Hip pain, acute, right   2. Knee pain, acute, right   3. Hordeolum eyelid, right   4. Skin lesions, generalized   5. Baker's cyst of knee, right    Mckensey Berghuis presents with family with multiple complaints tonight.  Patient is nonverbal and all history is obtained from the family.  He reports that home medications are not helping the patient's pain however they also report that tonight they gave her Tylenol and she is much better. Patient is full range of motion of all joints of bilateral lower extremities, there is no erythema or increased warmth of any joints. No swelling of any joints. She ambulates without difficulty and is in no apparent pain with palpation or movement of any of her joints.  X-ray of the hip and knee without evidence of obvious fracture or dislocation. Pt advised to follow up with orthopedics if symptoms persist for possibility of missed fracture diagnosis and further evaluation. onservative therapy recommended and discussed including an increased dose of ibuprofen.  Patient with clinically diagnosed hordeolum of the right upper eyelid; no purulent Drainage, no induration; no concern for orbital cellulitis.  Recommend conservative therapy including warm compresses several times per day and frequent handwashing.   Patient with excoriated and erythematous lesions  over the upper extremities and back. Patient's family reports that she creates these when she becomes angry and scratches herself until she bleeds. He reports that she then "picks at them" and they must cover them.  No evidence of abscess or cellulitis. We'll discharge home with  Bactroban.    It has been determined that no acute conditions requiring further emergency intervention are present at this time. The patient/guardian have been advised of the diagnosis and plan. We have discussed signs and symptoms that warrant return to the ED, such as changes or worsening in symptoms.   Vital signs are stable at discharge.   BP 133/93  Pulse 78  Temp(Src) 98.1 F (36.7 C) (Oral)  Resp 20  Wt 100 lb 8 oz (45.587 kg)  BMI 33.42 kg/m2  SpO2 96%  LMP 11/24/2012  Patient/guardian has voiced understanding and agreed to follow-up with the PCP or specialist.    I personally performed the services described in this documentation, which was scribed in my presence. The recorded information has been reviewed and is accurate.    Dahlia Client Ehab Humber, PA-C 12/21/12 2345

## 2012-12-22 NOTE — ED Provider Notes (Signed)
Medical screening examination/treatment/procedure(s) were performed by non-physician practitioner and as supervising physician I was immediately available for consultation/collaboration.    Candyce Churn, MD 12/22/12 [phone]

## 2012-12-28 ENCOUNTER — Other Ambulatory Visit (HOSPITAL_COMMUNITY): Payer: Self-pay | Admitting: Orthopaedic Surgery

## 2012-12-28 DIAGNOSIS — M25561 Pain in right knee: Secondary | ICD-10-CM

## 2013-01-05 NOTE — Patient Instructions (Signed)
MRI Screening  Pt Weight  unknown  Claustrophobia yes  Ever worked around metal, filing, grinding, or welding? no  Always wore protective glasses?   Ever got metal in your eyes?  If yes, was it removed by a doctor?   Brain Surgery no  Inner Ear Surgery no  Renal/Liver Dz no  Heart Surgery no   Pacemaker no High BP no  Eye Implants no  Pregnancy no  Diabetic no  Stent no (If yes, date of placement   Hx of cancer no   1.  Report to radiology on first floor on10/20  At10am/pm  2.  Do Not eat food after 0000        Do Not drink clear liquids after 0000  3.  If discharged the same day as procedure, you will need a responsible adult to drive          You home.  Who will drive you home pts mom  4.  If discharged the day of your procedure, a responsible adult should be with the patient      For 8 hours   5.  If plans include a taxi or bus for transportation home after procedure, a friend or family      member must accompany you      6.  If taking routine medications, please list the names and dosages of all your                   medications, or bring these medications to the hospital for identification   7.  Take usual medications the morning of your procedure, except   8.  Do you have a history of heart problems?  no  9.  Do you have a cardiologist? no  10.  Do you have any metal or implants inside your body? no  11.  Do you have any allergies to food or medications? crab  12.  Do you have any allergies to latex or contrast dye? no  13.  To whom were these instructions given? pts sister and mom

## 2013-01-08 ENCOUNTER — Ambulatory Visit (HOSPITAL_COMMUNITY)
Admission: RE | Admit: 2013-01-08 | Discharge: 2013-01-08 | Disposition: A | Discharge status: Home / Self Care | Payer: Medicaid Other | Source: Ambulatory Visit | Attending: Orthopaedic Surgery | Admitting: Orthopaedic Surgery

## 2013-01-08 ENCOUNTER — Encounter (HOSPITAL_COMMUNITY): Payer: Self-pay

## 2013-01-08 VITALS — BP 124/86 | HR 86 | Temp 97.5°F | Resp 15

## 2013-01-08 DIAGNOSIS — G8929 Other chronic pain: Secondary | ICD-10-CM | POA: Insufficient documentation

## 2013-01-08 DIAGNOSIS — M25561 Pain in right knee: Secondary | ICD-10-CM

## 2013-01-08 DIAGNOSIS — M25469 Effusion, unspecified knee: Secondary | ICD-10-CM | POA: Insufficient documentation

## 2013-01-08 DIAGNOSIS — M25569 Pain in unspecified knee: Secondary | ICD-10-CM | POA: Insufficient documentation

## 2013-01-08 MED ORDER — MIDAZOLAM HCL 2 MG/2ML IJ SOLN
INTRAMUSCULAR | Status: AC
  Filled 2013-01-08: qty 10

## 2013-01-08 MED ORDER — FENTANYL CITRATE 0.05 MG/ML IJ SOLN
INTRAMUSCULAR | Status: AC
  Filled 2013-01-08: qty 4

## 2013-01-08 MED ORDER — FENTANYL CITRATE 0.05 MG/ML IJ SOLN
25.0000 ug | INTRAMUSCULAR | Status: DC | PRN
  Administered 2013-01-08 (×2): 25 ug via INTRAVENOUS
  Filled 2013-01-08: qty 4

## 2013-01-08 MED ORDER — FENTANYL CITRATE 0.05 MG/ML IJ SOLN
INTRAMUSCULAR | Status: AC | PRN
  Administered 2013-01-08 (×3): 12.5 ug via INTRAVENOUS

## 2013-01-08 MED ORDER — MIDAZOLAM HCL 2 MG/2ML IJ SOLN
INTRAMUSCULAR | Status: AC | PRN
  Administered 2013-01-08 (×4): 0.5 mg via INTRAVENOUS

## 2013-01-08 MED ORDER — MIDAZOLAM HCL 2 MG/2ML IJ SOLN
1.0000 mg | INTRAMUSCULAR | Status: DC | PRN
  Administered 2013-01-08 (×2): 1 mg via INTRAVENOUS
  Filled 2013-01-08: qty 10

## 2013-01-08 NOTE — H&P (Signed)
Chief Complaint: "Right knee pain." Referring Physician: Dr. Roda Shutters HPI: Christine Leonard is an 32 y.o. female who is here today with her mother as the patient is non-verbal with developmental delays. Interpreter is also present.  The patient has been c/o right knee pain x 4 weeks with difficulty walking. She has had xrays on 12/21/12 with no acute findings. Pain has been increasing and patient has been scheduled for MRI today with conscious sedation. The mother denies any known difficulty to sedation medication including versed and fentanyl. She does have a history of seizures when she was 32 years old and has been on tegretol since with no recurrence of seizures. The mother states no known injury, however the patient does kick a lot and may have hurt her knee at that time. The patient denies any chest pain or shortness of breath at this time.   Past Medical History:  Past Medical History  Diagnosis Date  . Mental retardation   . Hyperlipidemia     was on med about a yr ago but not on @ present time(has been off about 5months)  . Asthma     Albuterol prn and Singulair every night  . Seizures     hx of seizures;last one at 23yrs ago;takes Tegretol daily  . Hemorrhoid     Past Surgical History:  Past Surgical History  Procedure Laterality Date  . No past surgeries    . Umbilical hernia repair N/A 09/18/2012    Procedure: LAPAROSCOPIC UMBILICAL HERNIA;  Surgeon: Axel Filler, MD;  Location: [facility] OR;  Service: General;  Laterality: N/A;  . Insertion of mesh N/A 09/18/2012    Procedure: INSERTION OF MESH;  Surgeon: Axel Filler, MD;  Location: [facility] OR;  Service: General;  Laterality: N/A;    Family History: History reviewed. No pertinent family history.  Social History:  reports that she has never smoked. She has never used smokeless tobacco. She reports that she does not drink alcohol or use illicit drugs.  Allergies:  Allergies  Allergen Reactions  . Crab [Shellfish Allergy]     Facial  swelling     Medication List    ASK your doctor about these medications       acetaminophen 325 MG tablet  Commonly known as:  TYLENOL  Take 650 mg by mouth every 6 (six) hours as needed for pain.     carBAMazepine 100 MG/5ML suspension  Commonly known as:  TEGRETOL  Take 100 mg by mouth 2 (two) times daily.     ibuprofen 800 MG tablet  Commonly known as:  ADVIL,MOTRIN  Take 1 tablet (800 mg total) by mouth 3 (three) times daily.     mupirocin cream 2 %  Commonly known as:  BACTROBAN  Apply topically 3 (three) times daily. To open wounds       Please HPI for pertinent positives, otherwise complete 10 system ROS negative.  Physical Exam: BP 117/65  Pulse 88  Temp(Src) 97.5 F (36.4 C) (Oral)  Resp 14  SpO2 98%  LMP 11/24/2012 There is no weight on file to calculate BMI.   General Appearance:  Alert, cooperative, no distress  Head:  Normocephalic, without obvious abnormality, atraumatic  Neck: Supple, symmetrical, trachea midline  Lungs:   Clear to auscultation bilaterally, no w/r/r, respirations unlabored without use of accessory muscles.  Chest Wall:  No tenderness or deformity  Heart:  Regular rate and rhythm, S1, S2 normal, no murmur, rub or gallop.  Abdomen:   Soft, non-tender, non distended, (+)  BS  Extremities: Extremities with no pain to palpation of right knee, RLE range of motion wnl. Baker's cyst right knee. Atraumatic, no cyanosis or edema  Pulses: 1+ and symmetric  Neurologic: Normal affect, no gross deficits.   No results found for this or any previous visit (from the past 48 hour(s)). No results found.  Assessment/Plan Worsening right knee pain x 4 weeks, s/p xrays no acute findings. Request for conscious sedation with right knee MRI History of seizures, on tegretol. No seizures since age 3 Patient with developmental delay, mother present today with interpreter.  Risks and Benefits discussed with the patient and her mother. All of the patient's  questions were answered, patient is agreeable to proceed. Consent signed and in chart.   Pattricia Boss D PA-C 01/08/2013, 10:50 AM

## 2013-04-23 ENCOUNTER — Emergency Department (HOSPITAL_COMMUNITY): Payer: Medicaid Other

## 2013-04-23 ENCOUNTER — Encounter (HOSPITAL_COMMUNITY): Payer: Self-pay | Admitting: Emergency Medicine

## 2013-04-23 ENCOUNTER — Emergency Department (HOSPITAL_COMMUNITY)
Admission: EM | Admit: 2013-04-23 | Discharge: 2013-04-23 | Disposition: A | Discharge status: Home / Self Care | Payer: Medicaid Other | Attending: Emergency Medicine | Admitting: Emergency Medicine

## 2013-04-23 DIAGNOSIS — R0602 Shortness of breath: Secondary | ICD-10-CM | POA: Insufficient documentation

## 2013-04-23 DIAGNOSIS — E785 Hyperlipidemia, unspecified: Secondary | ICD-10-CM | POA: Insufficient documentation

## 2013-04-23 DIAGNOSIS — F79 Unspecified intellectual disabilities: Secondary | ICD-10-CM | POA: Insufficient documentation

## 2013-04-23 DIAGNOSIS — Z888 Allergy status to other drugs, medicaments and biological substances status: Secondary | ICD-10-CM | POA: Insufficient documentation

## 2013-04-23 DIAGNOSIS — Z79899 Other long term (current) drug therapy: Secondary | ICD-10-CM | POA: Insufficient documentation

## 2013-04-23 DIAGNOSIS — J45901 Unspecified asthma with (acute) exacerbation: Secondary | ICD-10-CM

## 2013-04-23 DIAGNOSIS — R062 Wheezing: Secondary | ICD-10-CM | POA: Insufficient documentation

## 2013-04-23 DIAGNOSIS — G40909 Epilepsy, unspecified, not intractable, without status epilepticus: Secondary | ICD-10-CM | POA: Insufficient documentation

## 2013-04-23 DIAGNOSIS — Z760 Encounter for issue of repeat prescription: Secondary | ICD-10-CM | POA: Insufficient documentation

## 2013-04-23 DIAGNOSIS — K649 Unspecified hemorrhoids: Secondary | ICD-10-CM | POA: Insufficient documentation

## 2013-04-23 MED ORDER — PREDNISONE 50 MG PO TABS: 50.0000 mg | ORAL_TABLET | Freq: Every day | ORAL | Status: AC

## 2013-04-23 MED ORDER — ALBUTEROL SULFATE (2.5 MG/3ML) 0.083% IN NEBU: 5.0000 mg | INHALATION_SOLUTION | Freq: Four times a day (QID) | RESPIRATORY_TRACT | Status: AC | PRN

## 2013-04-23 MED ORDER — IPRATROPIUM BROMIDE 0.02 % IN SOLN
0.5000 mg | Freq: Once | RESPIRATORY_TRACT | Status: AC
Start: 2013-04-23 — End: 2013-04-23
  Administered 2013-04-23: 0.5 mg via RESPIRATORY_TRACT
  Filled 2013-04-23: qty 2.5

## 2013-04-23 MED ORDER — PREDNISONE 20 MG PO TABS
50.0000 mg | ORAL_TABLET | Freq: Once | ORAL | Status: AC
  Administered 2013-04-23: 50 mg via ORAL
  Filled 2013-04-23: qty 3

## 2013-04-23 MED ORDER — BENZONATATE 100 MG PO CAPS: 100.0000 mg | ORAL_CAPSULE | Freq: Three times a day (TID) | ORAL | Status: AC | PRN

## 2013-04-23 MED ORDER — ALBUTEROL (5 MG/ML) CONTINUOUS INHALATION SOLN
15.0000 mg/h | INHALATION_SOLUTION | Freq: Once | RESPIRATORY_TRACT | Status: AC
  Administered 2013-04-23: 15 mg/h via RESPIRATORY_TRACT
  Filled 2013-04-23: qty 20

## 2013-04-23 NOTE — ED Provider Notes (Signed)
CSN: [id number]     Arrival date & time 04/23/13  0251 History   First MD Initiated Contact with Patient 04/23/13 0255     Chief Complaint  Patient presents with  . Cough   (Consider location/radiation/quality/duration/timing/severity/associated sxs/prior Treatment) HPI  This a 33 year old female with a history of MR, hyperlipidemia, asthma, seizures who presents with shortness of breath and cough. Per the patient's family she has been coughing for the last week. No known fevers. They have run out of her nebulizers. Patient has a history of asthma. Patient is noncontributory to history taking and is nonverbal at baseline.  Level V caveat applies.  Past Medical History  Diagnosis Date  . Mental retardation   . Hyperlipidemia     was on med about a yr ago but not on @ present time(has been off about 5months)  . Asthma     Albuterol prn and Singulair every night  . Seizures     hx of seizures;last one at 29yrs ago;takes Tegretol daily  . Hemorrhoid    Past Surgical History  Procedure Laterality Date  . No past surgeries    . Umbilical hernia repair N/A 09/18/2012    Procedure: LAPAROSCOPIC UMBILICAL HERNIA;  Surgeon: Axel Filler, MD;  Location: [facility] OR;  Service: General;  Laterality: N/A;  . Insertion of mesh N/A 09/18/2012    Procedure: INSERTION OF MESH;  Surgeon: Axel Filler, MD;  Location: [facility] OR;  Service: General;  Laterality: N/A;   No family history on file. History  Substance Use Topics  . Smoking status: Never Smoker   . Smokeless tobacco: Never Used  . Alcohol Use: No   OB History   Grav Para Term Preterm Abortions TAB SAB Ect Mult Living                 Review of Systems  Unable to perform ROS: Patient nonverbal  Respiratory: Positive for shortness of breath and wheezing.     Allergies  Crab  Home Medications   Current Outpatient Rx  Name  Route  Sig  Dispense  Refill  . carBAMazepine (TEGRETOL) 100 MG/5ML suspension   Oral   Take 100 mg by mouth  2 (two) times daily.         Marland Kitchen guaiFENesin (ROBITUSSIN) 100 MG/5ML SOLN   Oral   Take 15 mLs by mouth every 4 (four) hours as needed for cough or to loosen phlegm.         . naproxen (NAPROSYN) 500 MG tablet   Oral   Take 500 mg by mouth 2 (two) times daily with a meal.         . albuterol (PROVENTIL) (2.5 MG/3ML) 0.083% nebulizer solution   Nebulization   Take 6 mLs (5 mg total) by nebulization every 6 (six) hours as needed for wheezing or shortness of breath.   75 mL   12   . benzonatate (TESSALON) 100 MG capsule   Oral   Take 1 capsule (100 mg total) by mouth 3 (three) times daily as needed for cough.   21 capsule   0   . predniSONE (DELTASONE) 50 MG tablet   Oral   Take 1 tablet (50 mg total) by mouth daily with breakfast.   4 tablet   0    BP 102/65  Pulse 138  Temp(Src) 98.2 F (36.8 C) (Oral)  Resp 20  SpO2 98%  LMP 03/26/2013 Physical Exam  Nursing note and vitals reviewed. Constitutional: No distress.  Small for  age, abnormal facies, no acute distress, coughing  HENT:  Head: Normocephalic and atraumatic.  Eyes: Pupils are equal, round, and reactive to light.  Neck: Neck supple.  Cardiovascular: Normal rate, regular rhythm and normal heart sounds.   No murmur heard. Pulmonary/Chest: Effort normal. No respiratory distress. She has wheezes.  Abdominal: Soft. Bowel sounds are normal. There is no tenderness.  Musculoskeletal: She exhibits no edema.  Neurological: She is alert.  Moves all 4 extremities spontaneously, does not follow commands  Skin: Skin is warm and dry.  Psychiatric: She has a normal mood and affect.    ED Course  Procedures (including critical care time) Labs Review Labs Reviewed - No data to display Imaging Review Dg Chest Portable 1 View  04/23/2013   CLINICAL DATA:  Cough for 1 week. Acute onset of shortness of breath.  EXAM: PORTABLE CHEST - 1 VIEW  COMPARISON:  Chest radiograph September 14, 2012.  FINDINGS: Cardiomediastinal  silhouette is unremarkable for this low inspiratory portable examination with crowded vasculature markings. The lungs are clear without pleural effusions or focal consolidations. Trachea projects midline and there is no pneumothorax. Included soft tissue planes and osseous structures are non-suspicious.  IMPRESSION: No acute cardiopulmonary process.   Electronically Signed   By: Awilda Metroourtnay  Bloomer   On: 04/23/2013 03:47    EKG Interpretation   None       MDM   1. Asthma exacerbation    Patient presents with cough and shortness of breath. She had wheezing on exam. History of asthma. Patient was given prednisone and a continuous neb. Chest x-ray is negative for acute pneumonia. On recheck, patient has had complete resolution of wheezing. She is comfortable appearing. She continues to cough. She ambulated with pulse ox and maintained oxygen saturations. She will be discharged home with prednisone and Tessalon Perles. They're also requesting refills for her nebulizer. This will be provided. They're given strict return precautions. All questions were answered.  After history, exam, and medical workup I feel the patient has been appropriately medically screened and is safe for discharge home. Pertinent diagnoses were discussed with the patient. Patient was given return precautions.     Shon Batonourtney F Cliford Sequeira, MD 04/23/13 [phone]

## 2013-04-23 NOTE — ED Notes (Signed)
Per family, pt c/o cough for the last week. Pt c/o of having trouble breathing. Denies any fevers, chills, nausea, vomiting. Pt states her eyes have been very itchy. Pt does not speak english, family translating.

## 2013-04-23 NOTE — ED Notes (Signed)
Pt up ambulatory with pulse ox in place.  Began journey with O2 sat @ 95% room air, ended at 100%.  Pt tolerated activity well.

## 2013-04-23 NOTE — Discharge Instructions (Signed)
Asma - Adultos   (Asthma, Adult)   El asma es una enfermedad que afecta los pulmones. Se caracteriza por la inflamación y estrechamiento de las vías respiratorias, así como aumento de la producción mucosa. La causa del estrechamiento es el edema y los espasmos musculares que se producen dentro de los conductos por los que pasa el aire. Cuando esto sucede, la respiración puede ser difícil y puede tener tos, sibilancias y falta de aire. Aprender más sobre su enfermedad le ayudará a manejarla mejor. El asma no puede curarse, pero los medicamentos y los cambios en el estilo de vida lo ayudarán a controlarla. Puede ser un problema menor para algunas personas, pero si no se controla puede conducir a un ataque potencialmente mortal. El asma puede cambiar con el tiempo. Es importante trabajar con su médico para controlar sus síntomas.  CAUSAS   La causa exacta es desconocida. Se cree que la causa son factores heredados (genéticosy la exposición a factores ambientales. En el asma hay irritación e hinchazón (inflamación) de las vías respiratorias. Puede originarse debido a alergias, infecciones virales del pulmón o irritantes presentes en el aire. Las reacciones alérgicas pueden causar sibilancias inmediatamente o varias horas después de una exposición. Los desencadenantes del asma son diferentes para cada persona. Es importante prestar atención y saber qué lo desencadena.   Algunos desencadenantes comunes para los ataques de asma son:   · La caspa que eliminan los animales de la piel, el pelo o las plumas de los animales.  · Los ácaros del polvo que se encuentran en el polvo de la casa.  · [place]ar[place]as.  · El pólen de los árboles o el césped.  · El moho.  · El humo del cigarrillo o del tabaco. NO DEBE PERMITIRSE QUE SE FUME EN LOS HOGARES DE PERSONAS CON ASMA. Las personas asmáticas no deben fumar y no deben estar cerca de fumadores.  · Sustancias contaminantes como el polvo, limpiadores hogareños, sprays para el cabello,  aerosoles, vapores de pintura, sustancias químicas fuertes u olores intensos.  · El aire frío o los cambios climáticos. El aire frío puede causar inflamación. El viento aumenta la cantidad de moho y polen del aire. No hay ningún clima particular que sea el mejor para un asmático.  · Emociones fuertes, como llorar o reir intensamente.  · Estrés.  · Ciertos medicamentos como la aspirina o los betabloqueantes.  · Los sulfitos que se encuentran en medicamentos y bebidas como frutas desecadas y el vino.  · Enfermedades infecciosas o inflamatorias como la gripe, el resfrío o una inflamación de las membranas nasales (rinitis).  · Reflujo gastroesofágico (ERGE). El ERGE es una enfermedad del estómago en el que los ácidos vuelven a la garganta (esófago).  · Ejercicios o actividad extenuante. Algunos medicamentos administrados antes del ejercicio permiten que la mayoría de las personas puedan participar en los deportes.  SÍNTOMAS   · Falta de aire.  · Opresión o dolor en el pecho.  · Dificultad para dormir debido a la tos, sibilancias o falta de aire.  · Un silbido o sibilancias con la espiración.  · Tos o sibilancias que empeoran cuando usted:  · Tiene un virus (como un resfriado o gripe).  · Sufre de alergias.  · Está expuesto a ciertos vapores o sustancias químicas.  · La práctica de ejercicios.  Los signos de que su asma está empeorando son:   · Signos y síntomas de asma más frecuentes y molestos.  · Aumenta la dificultad para respirar. Esto puede   medirse con un medidor de flujo máximo, que es un dispositivo simple que se usa para comprobar como están funcionando los pulmones.  · La necesidad cada vez más frecuente de utilizar un inhalador de alivio rápido.  DIAGNÓSTICO   El diagnóstico se realiza revisando su historia clínica a través de un examen físico y con algunos estudios. Algunos estudios de la función pulmonar pueden ayudar con el diagnóstico.   TRATAMIENTO   El asma no se cura. Sin embargo, en la mayoría de los  adultos puede controlarse con tratamiento. Además de evitar los desencadenantes, será necesario que use medicamentos. Hay 2 tipos de medicamento que se utilizan en el tratamiento para el asma: los medicamentos controladores (reducen la inflamación y los síntomas) y los medicamentos de alivio o de rescate ( alivian los síntomas durante los ataques agudos). Es posible que necesite medicamentos todos los días para controlar el asma. Los medicamentos controladores más eficaces para el asma son los corticoides inhalados (reducen la inflamación). Otros medicamentos de control a largo plazo incluyen:   · Antagonistas de los receptores de leucotrienos (bloquea una vía de inflamación).  · Beta2-agonistas de acción prolongada (relajan los músculos de las vías respiratorias durante al menos 12 horas) con un corticoides inhalado.  · El cromoglicato o nedocromil sódico (modifica la capacidad de ciertas células inflamatorias para liberar los productos químicos que causa inflamación).  · Los inmunomoduladores (alteran el sistema inmunológico para prevenir los síntomas del asma).  · La teofilina (relaja los músculos de las vías respiratorias).  Puede ser posible que necesite beta2-agonistas de corta acción para aliviar los síntomas del asma durante un ataque agudo.   Usted debe saber qué hacer durante un ataque agudo. Los inhalantes son eficaces cuando se utilizan adecuadamente. Lea las instrucciones para saber como usar los medicamentos correctamente, y hable con su médico si tiene dudas. Concurra a los controles regularmente para asegurarse que el asma está bien controlado. Si no está bien controlado, si ha sido hospitalizado por asma o si se necesitan múltiples medicamentos o dosis medias a altas de corticoides inhalados para controlarlo, pida que lo deriven a un especialista en asma.   INSTRUCCIONES PARA EL CUIDADO EN EL HOGAR   · Tome todos los medicamentos según le indicó su médico.  · Controle el ambiente hogareño en los  siguientes modos para prevenir los ataques de asma:  · Cambie el filtro de la calefacción y del aire acondicionado al menos una vez al mes.  · Coloque un filtro o estopa sobre la ventilación de la calefacción o del aire acondicionado.  · Limite el uso de hogares o estufas a leña.  · No fume. No permanezca en lugares en los que otras personas fuman.  · Elimine las plagas (cucarachas, ratones) y sus excrementos.  · Si observa moho en una planta, elimínela.  · Limpie los pisos y elimine el polvo una vez por semana. Utilice productos sin perfume. Utilice una aspiradora con filtros HEPA, siempre que le sea posible. Si la aspiradora o las tareas de limpieza son los desencadenantes de su asma, trate de encontrar a otra persona para hacer estas tareas.  · Los pisos de su casa deben ser de madera, baldosas o vinilo. Las alfombras pueden retener la caspa y el polvo.  · Use almohadas, mantas y cubre colchones antialérgicos.  · Lave las sábanas y mantas todas las semanas con agua caliente, y séquelas con calor.  · Use mantas de poliéster o algodón de tejido apretado.  · No use cobertores   sobre la cama.  · Limpie el baño y la cocina con lavandina y píntelos con pintura antihongos.  · Lávese las manos con frecuencia.  · Converse con el médico acerca del plan de acción para controlar los ataques de asma. Incluye el uso de un medidor de flujo, que mide la gravedad del ataque, y medicamentos que ayuden a detener el ataque. Un plan de acción que lo ayude a minimizar o detener los ataques sin necesidad de solicitar atención médica.  · Mantenga la calma durante el ataque.  · Cuente siempre con un plan para solicitar atención médica. Debe incluir la forma de contactar a su médico y, en caso de un ataque grave, comunicarse con el servicio de emergencias de su localidad (911 en los [place]).  SOLICITE ATENCIÓN MÉDICA SI:   · Respira con dificultad aún cuando toma la medicina para prevenir los ataques.  · Elimina flema cada vez más  espesa.  · Su esputo cambia de un color claro o blanco a un color amarillo, verde, gris o sanguinolento.  · Tiene trastornos ocasionados por la medicina que está tomando (como urticaria, picazón, hinchazón o dificultades respiratorias).  · Necesita un medicamento aliviador más de 2 o 3 veces por semana.  · Su flujo máximo aún está en 50-79% del mejor valor personal después de seguir el plan de acción durante 1 hora.  SOLICITE ATENCIÓN MÉDICA DE INMEDIATO SI:   · Le falta el aire, incluso en reposo.  · Le falta el aire aún cuando hace muy poca actividad física.  · Tiene dificultad para comer, beber o hablar debido a los síntomas del asma.  · Siente dolor en el pecho o el corazón palpita muy rápido.  · Tiene los labios o las uñas de tono azulado.  · Usted se siente mareado, débil o se desmaya.  · Tiene fiebre o síntomas que persisten durante más de 2 o 3 días.  · Tiene fiebre y los síntomas empeoran de manera súbita.  · Usted parece empeorar y no responde al tratamiento durante un ataque de asma.  · Su flujo máximo es menor del 50% del mejor valor personal.  ASEGÚRESE DE QUE:   · Comprende estas instrucciones.  · Controlará su enfermedad.  · Solicitará ayuda de inmediato si no mejora o si empeora.  Document Released: 03/08/2005 Document Revised: 02/23/2012  [employer]® Patient Information ©2014 [employer].

## 2015-10-21 IMAGING — CR DG CHEST 1V PORT
1 series · 1 of 1 positions shown · non-contrast
Comparison: Chest radiograph September 14, 2012.

CLINICAL DATA: Cough for 1 week. Acute onset of shortness of
breath.

EXAM:
PORTABLE CHEST - 1 VIEW

[AP]
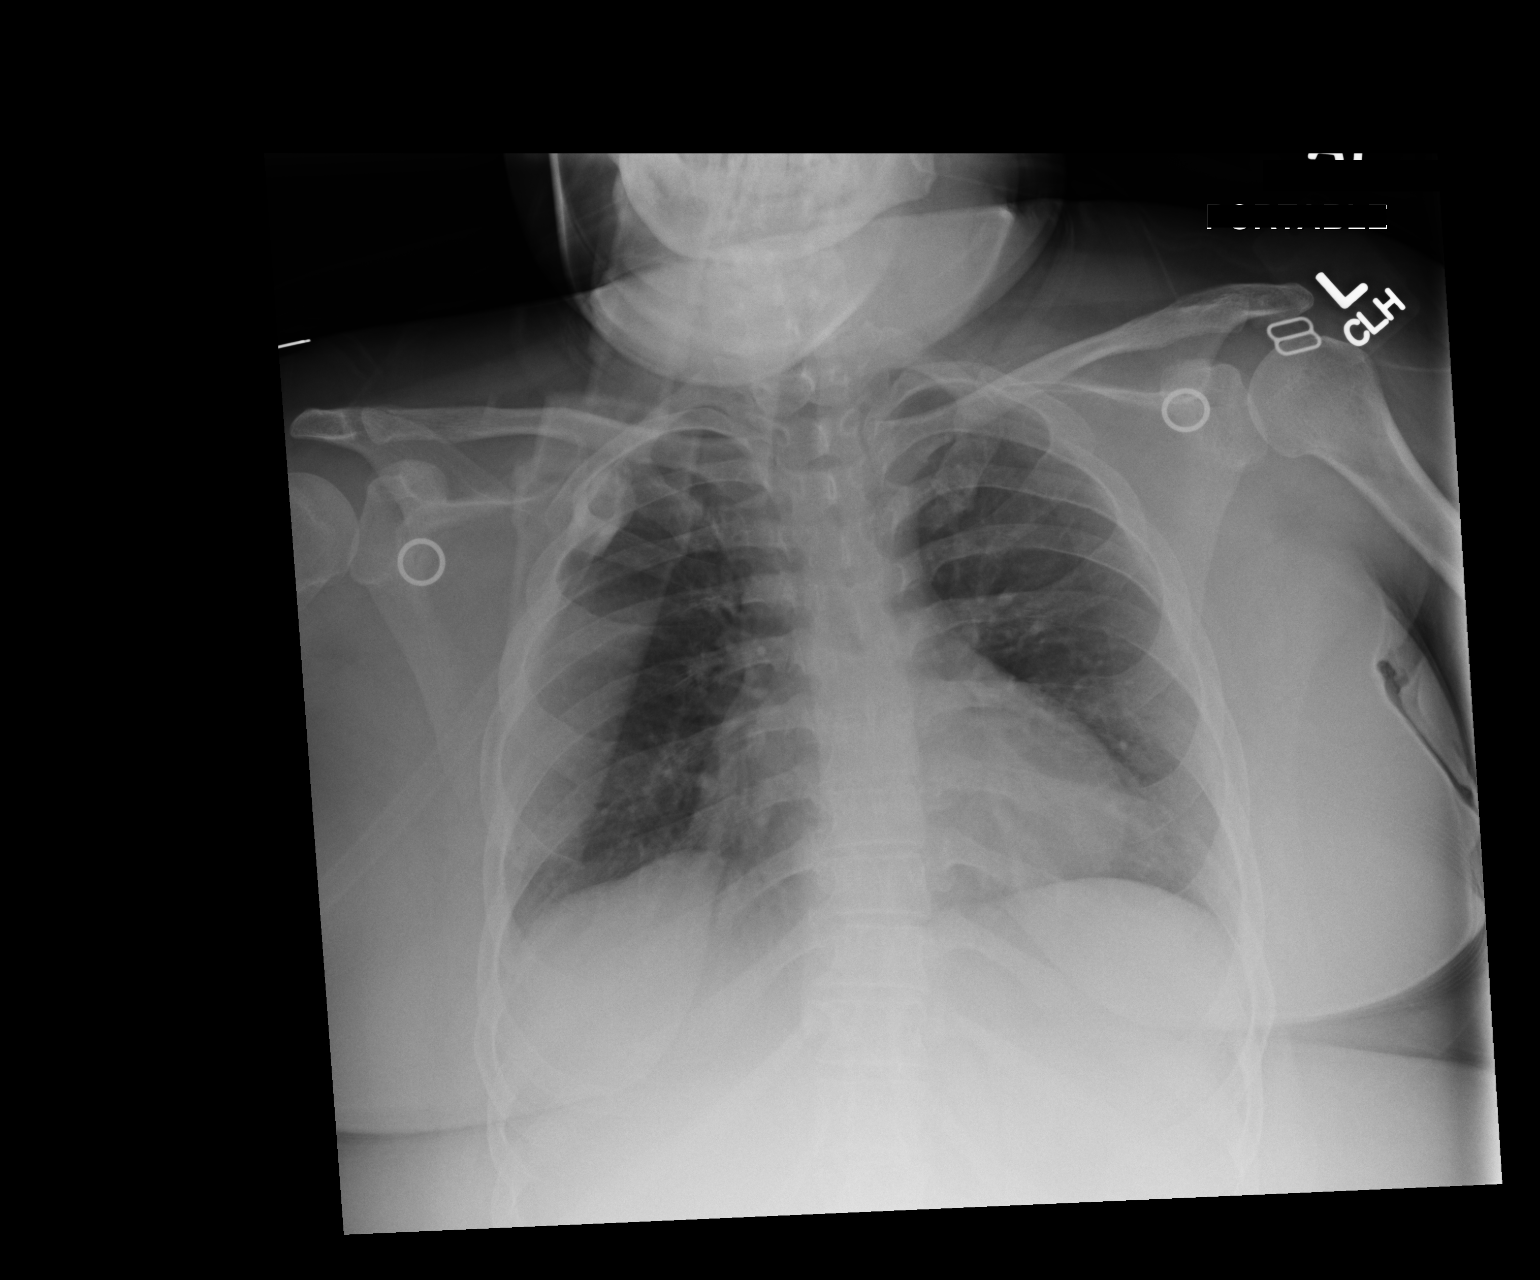

[1 of 1 positions shown; findings below may reference images not displayed]

FINDINGS: Cardiomediastinal silhouette is unremarkable for this low
inspiratory portable examination with crowded vasculature markings.
The lungs are clear without pleural effusions or focal
consolidations. Trachea projects midline and there is no
pneumothorax. Included soft tissue planes and osseous structures are
non-suspicious.
IMPRESSION: No acute cardiopulmonary process.

  By: Mmushi Quluba
